# Patient Record
Sex: Male | Born: 1960 | Race: Black or African American | Hispanic: No | Smoking: Never smoker
Health system: Southern US, Community
[De-identification: ages and names within clinical notes are randomized; demographics above are authoritative.]

## PROBLEM LIST (undated history)

## (undated) DIAGNOSIS — J439 Emphysema, unspecified: Secondary | ICD-10-CM

---

## 2013-08-30 ENCOUNTER — Emergency Department (HOSPITAL_COMMUNITY): Payer: No Typology Code available for payment source

## 2013-08-30 ENCOUNTER — Encounter (HOSPITAL_COMMUNITY): Payer: Self-pay | Admitting: Emergency Medicine

## 2013-08-30 ENCOUNTER — Emergency Department (HOSPITAL_COMMUNITY)
Admission: EM | Admit: 2013-08-30 | Discharge: 2013-08-30 | Disposition: A | Discharge status: Home / Self Care | Payer: No Typology Code available for payment source | Attending: Emergency Medicine | Admitting: Emergency Medicine

## 2013-08-30 DIAGNOSIS — Y92410 Unspecified street and highway as the place of occurrence of the external cause: Secondary | ICD-10-CM | POA: Insufficient documentation

## 2013-08-30 DIAGNOSIS — S0003XA Contusion of scalp, initial encounter: Secondary | ICD-10-CM | POA: Insufficient documentation

## 2013-08-30 DIAGNOSIS — S0990XA Unspecified injury of head, initial encounter: Secondary | ICD-10-CM | POA: Insufficient documentation

## 2013-08-30 DIAGNOSIS — S139XXA Sprain of joints and ligaments of unspecified parts of neck, initial encounter: Secondary | ICD-10-CM | POA: Insufficient documentation

## 2013-08-30 DIAGNOSIS — J438 Other emphysema: Secondary | ICD-10-CM | POA: Insufficient documentation

## 2013-08-30 DIAGNOSIS — S1093XA Contusion of unspecified part of neck, initial encounter: Secondary | ICD-10-CM

## 2013-08-30 DIAGNOSIS — S0083XA Contusion of other part of head, initial encounter: Secondary | ICD-10-CM | POA: Insufficient documentation

## 2013-08-30 DIAGNOSIS — Y9389 Activity, other specified: Secondary | ICD-10-CM | POA: Insufficient documentation

## 2013-08-30 DIAGNOSIS — S161XXA Strain of muscle, fascia and tendon at neck level, initial encounter: Secondary | ICD-10-CM

## 2013-08-30 HISTORY — DX: Emphysema, unspecified: J43.9

## 2013-08-30 MED ORDER — IBUPROFEN 800 MG PO TABS: 800.0000 mg | ORAL_TABLET | Freq: Three times a day (TID) | ORAL | Status: AC | PRN

## 2013-08-30 MED ORDER — HYDROCODONE-ACETAMINOPHEN 5-325 MG PO TABS
1.0000 | ORAL_TABLET | Freq: Once | ORAL | Status: AC
  Administered 2013-08-30: 1 via ORAL
  Filled 2013-08-30: qty 1

## 2013-08-30 MED ORDER — IBUPROFEN 800 MG PO TABS
800.0000 mg | ORAL_TABLET | Freq: Once | ORAL | Status: AC
  Administered 2013-08-30: 800 mg via ORAL
  Filled 2013-08-30: qty 1

## 2013-08-30 MED ORDER — HYDROCODONE-ACETAMINOPHEN 5-325 MG PO TABS: 1.0000 | ORAL_TABLET | Freq: Four times a day (QID) | ORAL | Status: AC | PRN
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
## 2013-08-30 NOTE — Discharge Instructions (Signed)
Return here as needed.  Followup with an urgent care primary care Dr. a CT scan and x-rays did not show any abnormalities

## 2013-08-30 NOTE — ED Notes (Signed)
Pt is at [facility] saying he wants to leave. He said he has been here too long and just wants to go home and lay down. Pt last seen walking back to his room, is currently standing in his doorway.
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
## 2013-08-31 NOTE — ED Provider Notes (Signed)
Medical screening examination/treatment/procedure(s) were performed by non-physician practitioner and as supervising physician I was immediately available for consultation/collaboration.   EKG Interpretation None       Saahir Prude R. Silvanna Ohmer, MD 08/31/13 0005 
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
